# Patient Record
Sex: Female | Born: 2000 | Race: White | Hispanic: No | Marital: Single | Smoking: Never smoker
Health system: Southern US, Community
[De-identification: ages and names within clinical notes are randomized; demographics above are authoritative.]

## PROBLEM LIST (undated history)

## (undated) DIAGNOSIS — R51 Headache: Secondary | ICD-10-CM

## (undated) DIAGNOSIS — R413 Other amnesia: Secondary | ICD-10-CM

## (undated) DIAGNOSIS — R519 Headache, unspecified: Secondary | ICD-10-CM

## (undated) HISTORY — DX: Headache, unspecified: R51.9

## (undated) HISTORY — DX: Other amnesia: R41.3

## (undated) HISTORY — DX: Headache: R51

## (undated) HISTORY — PX: TONSILLECTOMY AND ADENOIDECTOMY: SHX28

---
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
## 2021-01-08 ENCOUNTER — Other Ambulatory Visit: Payer: Self-pay

## 2021-01-08 DIAGNOSIS — R3982 Chronic bladder pain: Secondary | ICD-10-CM | POA: Diagnosis not present

## 2021-01-08 DIAGNOSIS — M6289 Other specified disorders of muscle: Secondary | ICD-10-CM | POA: Diagnosis not present

## 2021-01-08 DIAGNOSIS — M62838 Other muscle spasm: Secondary | ICD-10-CM | POA: Diagnosis not present

## 2021-01-08 DIAGNOSIS — R3915 Urgency of urination: Secondary | ICD-10-CM | POA: Diagnosis not present

## 2021-01-08 DIAGNOSIS — N941 Unspecified dyspareunia: Secondary | ICD-10-CM | POA: Diagnosis not present

## 2021-03-06 DIAGNOSIS — Z6823 Body mass index (BMI) 23.0-23.9, adult: Secondary | ICD-10-CM | POA: Diagnosis not present

## 2021-03-06 DIAGNOSIS — Z01419 Encounter for gynecological examination (general) (routine) without abnormal findings: Secondary | ICD-10-CM | POA: Diagnosis not present

## 2021-03-13 ENCOUNTER — Other Ambulatory Visit: Payer: Self-pay

## 2021-03-13 DIAGNOSIS — M549 Dorsalgia, unspecified: Secondary | ICD-10-CM | POA: Diagnosis not present

## 2021-03-13 DIAGNOSIS — M419 Scoliosis, unspecified: Secondary | ICD-10-CM | POA: Diagnosis not present

## 2021-03-13 DIAGNOSIS — J0100 Acute maxillary sinusitis, unspecified: Secondary | ICD-10-CM | POA: Diagnosis not present

## 2021-03-13 DIAGNOSIS — F419 Anxiety disorder, unspecified: Secondary | ICD-10-CM | POA: Diagnosis not present

## 2021-03-13 MED ORDER — NAPROXEN 500 MG PO TABS
ORAL_TABLET | ORAL | 0 refills | Status: DC
  Filled 2021-03-13: qty 20, 10d supply, fill #0

## 2021-03-13 MED ORDER — LEVOFLOXACIN 500 MG PO TABS
ORAL_TABLET | ORAL | 0 refills | Status: DC
  Filled 2021-03-13: qty 7, 7d supply, fill #0

## 2021-03-13 MED ORDER — CYCLOBENZAPRINE HCL 5 MG PO TABS
ORAL_TABLET | ORAL | 0 refills | Status: DC
  Filled 2021-03-13: qty 20, 10d supply, fill #0

## 2021-04-09 DIAGNOSIS — H52223 Regular astigmatism, bilateral: Secondary | ICD-10-CM | POA: Diagnosis not present

## 2021-04-09 DIAGNOSIS — H5203 Hypermetropia, bilateral: Secondary | ICD-10-CM | POA: Diagnosis not present

## 2021-04-10 DIAGNOSIS — F4323 Adjustment disorder with mixed anxiety and depressed mood: Secondary | ICD-10-CM | POA: Diagnosis not present

## 2021-04-17 DIAGNOSIS — F4323 Adjustment disorder with mixed anxiety and depressed mood: Secondary | ICD-10-CM | POA: Diagnosis not present

## 2021-04-24 DIAGNOSIS — F4323 Adjustment disorder with mixed anxiety and depressed mood: Secondary | ICD-10-CM | POA: Diagnosis not present

## 2021-05-15 DIAGNOSIS — F4323 Adjustment disorder with mixed anxiety and depressed mood: Secondary | ICD-10-CM | POA: Diagnosis not present

## 2021-05-20 DIAGNOSIS — F4323 Adjustment disorder with mixed anxiety and depressed mood: Secondary | ICD-10-CM | POA: Diagnosis not present

## 2021-06-10 ENCOUNTER — Other Ambulatory Visit: Payer: Self-pay

## 2021-06-11 ENCOUNTER — Other Ambulatory Visit: Payer: Self-pay

## 2021-06-11 MED ORDER — FLUOXETINE HCL 20 MG PO CAPS
ORAL_CAPSULE | ORAL | 1 refills | Status: DC
  Filled 2021-06-11: qty 90, 90d supply, fill #0

## 2021-06-12 ENCOUNTER — Other Ambulatory Visit: Payer: Self-pay

## 2021-06-12 DIAGNOSIS — Z0000 Encounter for general adult medical examination without abnormal findings: Secondary | ICD-10-CM | POA: Diagnosis not present

## 2021-06-12 DIAGNOSIS — Z131 Encounter for screening for diabetes mellitus: Secondary | ICD-10-CM | POA: Diagnosis not present

## 2021-06-12 DIAGNOSIS — F419 Anxiety disorder, unspecified: Secondary | ICD-10-CM | POA: Diagnosis not present

## 2021-06-12 DIAGNOSIS — Z114 Encounter for screening for human immunodeficiency virus [HIV]: Secondary | ICD-10-CM | POA: Diagnosis not present

## 2021-06-12 DIAGNOSIS — Z1159 Encounter for screening for other viral diseases: Secondary | ICD-10-CM | POA: Diagnosis not present

## 2021-06-12 DIAGNOSIS — F330 Major depressive disorder, recurrent, mild: Secondary | ICD-10-CM | POA: Diagnosis not present

## 2021-06-12 DIAGNOSIS — M419 Scoliosis, unspecified: Secondary | ICD-10-CM | POA: Diagnosis not present

## 2021-06-12 DIAGNOSIS — Z13220 Encounter for screening for lipoid disorders: Secondary | ICD-10-CM | POA: Diagnosis not present

## 2021-06-12 DIAGNOSIS — M546 Pain in thoracic spine: Secondary | ICD-10-CM | POA: Diagnosis not present

## 2021-06-12 MED ORDER — BUPROPION HCL ER (SR) 100 MG PO TB12
ORAL_TABLET | ORAL | 0 refills | Status: DC
  Filled 2021-06-12: qty 60, 30d supply, fill #0

## 2021-06-12 MED ORDER — FLUOXETINE HCL 10 MG PO CAPS
ORAL_CAPSULE | ORAL | 0 refills | Status: DC
  Filled 2021-06-12: qty 21, 21d supply, fill #0

## 2021-10-12 DIAGNOSIS — R300 Dysuria: Secondary | ICD-10-CM | POA: Diagnosis not present

## 2021-10-15 DIAGNOSIS — R059 Cough, unspecified: Secondary | ICD-10-CM | POA: Diagnosis not present

## 2021-10-18 DIAGNOSIS — R300 Dysuria: Secondary | ICD-10-CM | POA: Diagnosis not present

## 2021-10-18 DIAGNOSIS — N3001 Acute cystitis with hematuria: Secondary | ICD-10-CM | POA: Diagnosis not present

## 2021-10-18 NOTE — Progress Notes (Signed)
 [facility] ENCOUNTER     CLINICAL IMPRESSION Final diagnoses:  Dysuria  Acute cystitis with hematuria (Primary)    ASSESSMENT/PLAN & ED COURSE  Is an afebrile, nontoxic-appearing 21 year old female presenting for evaluation of continued urinary symptoms and lower back pain following treatment with Macrobid for a UTI that was diagnosed at [facility] with culture showing pansensitive E. coli.  On exam patient has bilateral CVA tenderness.  Patient's UA in clinic was positive for UTI with positive nitrates, positive leukocytes, positive heme.  We will start patient on a course of Bactrim due to concerns for possible extension to her kidneys with her CVA tenderness.  Patient be contacted with the results of her culture that was sent to confirm the source of her UTI.  There are concerns for possible pyelonephritis.  Pertinent labs & imaging results which were available during my care of the patient were reviewed by me (see chart for details).   _______________________________________________________________  Time seen: October 18, 2021 6:32 PM  I have reviewed the triage vital signs and the nursing notes.  CHIEF COMPLAINT   Chief Complaint  Patient presents with  . Possible UTI    Pt states that she was diagnosed with UTI by [facility] on Monday. Pt was given macrobid with no improvement. Pt states that low back pain is worse, burning with urination, and lower abominal pain and pressure . Last dose Azo 1 hr ago. Per pt, urine culture showed E. Coli    HPI  Danielle [name] is a 21 y.o. female with past medical history as listed below and recent diagnosis of a UTI at [facility] this past Monday and treatment with Macrobid over the past 5 days with no improvement in symptoms and onset of bilateral back pain.  Patient states usually gets back pain with UTIs however is not this significant and not as high up.   PAST MEDICAL HISTORY   No  past medical history on file.  Patient Active Problem List  Diagnosis  . Word finding difficulty  . Transient disorder of initiating or maintaining sleep  . Subjective memory complaints  . New daily persistent headache  . Nausea  . Hypotension  . Food intolerance  . Disequilibrium  . Anxiety     SURGICAL HISTORY   Past Surgical History:  Procedure Laterality Date  . TONSILLECTOMY      CURRENT MEDICATIONS    Current Outpatient Medications:  .  buPROPion  (WELLBUTRIN  SR) 100 MG 12 hr tablet, Take by mouth., Disp: , Rfl:  .  fluticasone propionate (FLONASE) 50 mcg/actuation nasal spray, , Disp: , Rfl:  .  levonorgestreL  (KYLEENA ) 19.5 mcg/24 hr (5 years) IUD IUD, Take 1 device by intrauterine route as directed., Disp: , Rfl:  .  sulfamethoxazole-trimethoprim (BACTRIM DS) 800-160 mg per tablet, Take 1 tablet (160 mg of trimethoprim total) by mouth Two (2) times a day for 5 days., Disp: 10 tablet, Rfl: 0  ALLERGIES   Allergies  Allergen Reactions  . Adhesive Tape-Silicones Rash  . Penicillins Rash     PHYSICAL EXAM    VITAL SIGNS:   Vitals:   10/18/21 1823  BP: 110/73  Pulse: 85  Resp: 16  Temp: 37.5 C (99.5 F)  SpO2: 97%     Constitutional: Alert and oriented. Well appearing and in no distress. HENT      Ears: Bilateral TM pearly grey with no erythema, discharge or effusion.      Head:  Normocephalic and atraumatic.      Eyes: Conjunctivae are normal.      Nose: No congestion.      Mouth/Throat: Mucous membranes are moist. Neck: No stridor. Cardiovascular: Normal rate, regular rhythm. Normal and symmetric distal pulses are present in all extremities. Pulmonary/Chest: Normal respiratory effort. Breath sounds are normal. Abdominal: Soft and nontender. There is no CVA tenderness. Musculoskeletal: Nontender with normal range of motion in all extremities.      Right lower leg: No tenderness or edema.      Left lower leg: No tenderness or edema. Neurological:  Normal speech and language. No gross focal neurologic deficits are appreciated. Skin: Skin is warm, dry and intact. No rash noted. Psychiatric:Normal mood and affect. Speech and behavior are normal.   Any obtained pertinent Labs & Imaging studies were reviewed. (See chart for details)  LABS Results for orders placed or performed in visit on 10/18/21  POCT Urinalysis Automated  Result Value Ref Range   Color, UA     Clarity, UA     Glucose, UA 250 mg/dL Negative   Bilirubin, UA Small Negative   Ketones, POC 15 mg/dL Negative   Spec Grav, UA 1.010 1.005 - 1.030   Blood, UA Negative Negative   pH, UA 5.0 5.0 - 9.0   Protein, UA >/= 300 mg/dL Negative   Urobilinogen, UA 4.0 mg/dL Negative (0.2 mg/dL)   Nitrite, UA Positive Negative   Leukocytes, UA Large Negative   UA Strip Lot Number [id number]    UA Strip Lot Expiration 07/2022    Serial Number [id number]     EKG   No results found for this visit on 10/18/21 (from the past 4464 hour(s)).  RADIOLOGY   No results found.  MEDICATIONS ADMINISTERED DURING THE VISIT   PRESCRIPTIONS THIS VISIT New Prescriptions   SULFAMETHOXAZOLE-TRIMETHOPRIM (BACTRIM DS) 800-160 MG PER TABLET    Take 1 tablet (160 mg of trimethoprim total) by mouth Two (2) times a day for 5 days.     I reviewed indications for follow-up and ED precautions including worsening shortness of breath, prolonged fevers, or any atypical symptoms.   The patient was given an opportunity to ask questions about the treatment plan, and all questions were addressed to the best of my ability with the information available. Patient stable at the time of discharge        Portions of this record have been created using Scientist, clinical (histocompatibility and immunogenetics). Dictation errors have been sought, but may not have been identified and corrected.

## 2021-12-17 DIAGNOSIS — N911 Secondary amenorrhea: Secondary | ICD-10-CM | POA: Diagnosis not present

## 2021-12-17 DIAGNOSIS — N912 Amenorrhea, unspecified: Secondary | ICD-10-CM | POA: Diagnosis not present

## 2022-03-10 DIAGNOSIS — Z113 Encounter for screening for infections with a predominantly sexual mode of transmission: Secondary | ICD-10-CM | POA: Diagnosis not present

## 2022-03-10 DIAGNOSIS — Z01419 Encounter for gynecological examination (general) (routine) without abnormal findings: Secondary | ICD-10-CM | POA: Diagnosis not present

## 2022-03-10 DIAGNOSIS — Z6821 Body mass index (BMI) 21.0-21.9, adult: Secondary | ICD-10-CM | POA: Diagnosis not present

## 2022-03-10 DIAGNOSIS — N760 Acute vaginitis: Secondary | ICD-10-CM | POA: Diagnosis not present

## 2022-03-12 ENCOUNTER — Other Ambulatory Visit: Payer: Self-pay

## 2022-03-12 DIAGNOSIS — F330 Major depressive disorder, recurrent, mild: Secondary | ICD-10-CM | POA: Diagnosis not present

## 2022-03-12 DIAGNOSIS — F411 Generalized anxiety disorder: Secondary | ICD-10-CM | POA: Diagnosis not present

## 2022-03-12 DIAGNOSIS — M419 Scoliosis, unspecified: Secondary | ICD-10-CM | POA: Diagnosis not present

## 2022-03-12 MED ORDER — BUPROPION HCL ER (XL) 150 MG PO TB24
150.0000 mg | ORAL_TABLET | Freq: Every day | ORAL | 1 refills | Status: DC
  Filled 2022-03-12: qty 90, 90d supply, fill #0

## 2022-05-04 DIAGNOSIS — J019 Acute sinusitis, unspecified: Secondary | ICD-10-CM | POA: Diagnosis not present

## 2022-06-23 DIAGNOSIS — Z111 Encounter for screening for respiratory tuberculosis: Secondary | ICD-10-CM | POA: Diagnosis not present

## 2022-06-25 DIAGNOSIS — Z111 Encounter for screening for respiratory tuberculosis: Secondary | ICD-10-CM | POA: Diagnosis not present

## 2022-07-02 ENCOUNTER — Telehealth: Payer: Self-pay | Admitting: Physician Assistant

## 2022-07-02 ENCOUNTER — Ambulatory Visit (INDEPENDENT_AMBULATORY_CARE_PROVIDER_SITE_OTHER): Payer: Commercial Managed Care - PPO | Admitting: Physician Assistant

## 2022-07-02 ENCOUNTER — Other Ambulatory Visit: Payer: Self-pay

## 2022-07-02 ENCOUNTER — Encounter: Payer: Self-pay | Admitting: Physician Assistant

## 2022-07-02 VITALS — BP 118/80 | HR 105 | Temp 98.3°F | Resp 16 | Ht 67.0 in | Wt 135.6 lb

## 2022-07-02 DIAGNOSIS — E782 Mixed hyperlipidemia: Secondary | ICD-10-CM

## 2022-07-02 DIAGNOSIS — R946 Abnormal results of thyroid function studies: Secondary | ICD-10-CM

## 2022-07-02 DIAGNOSIS — G43009 Migraine without aura, not intractable, without status migrainosus: Secondary | ICD-10-CM | POA: Diagnosis not present

## 2022-07-02 DIAGNOSIS — Z7689 Persons encountering health services in other specified circumstances: Secondary | ICD-10-CM | POA: Diagnosis not present

## 2022-07-02 DIAGNOSIS — R5383 Other fatigue: Secondary | ICD-10-CM | POA: Diagnosis not present

## 2022-07-02 MED ORDER — SUMATRIPTAN SUCCINATE 50 MG PO TABS
50.0000 mg | ORAL_TABLET | ORAL | 0 refills | Status: DC | PRN
  Filled 2022-07-02 (×2): qty 9, 15d supply, fill #0

## 2022-07-02 MED ORDER — TOPIRAMATE 25 MG PO TABS
25.0000 mg | ORAL_TABLET | Freq: Two times a day (BID) | ORAL | 2 refills | Status: DC
  Filled 2022-07-02: qty 60, 30d supply, fill #0

## 2022-07-02 NOTE — Progress Notes (Signed)
[facility] [address] [place], [place] [address]  Internal MEDICINE  Office Visit Note  Patient Name: Danielle [name]  [id number]  [id number]  Date of Service: 07/06/2022   Complaints/HPI Pt is here for establishment of PCP. Chief Complaint  Patient presents with   New Patient (Initial Visit)   Migraine    Prescribed medication, stopped taking it, but might need it again.   ADHD    Might need to be screened for ADHD.   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 -Migraines for a long time, have been more frequent. Headache every day now, will try to avoid taking ibuprofen daily, this only dulls the ache doesn't stop it. At least every other week has a migraine that she then goes to a dark quiet room and tries to sleep it off.  -Previously was on an as needed med, but never knew when migraine were starting and would set in before she could the medication in time. Interested in a preventive medication given frequency of headaches. -Does take wellbutrin, doesn't take everyday--admits she forgets. Plans to restart on Monday. Takes this for anxiety -Only takes flexeril as needed -Does feel like she has ADHD, loses focus and never gets anything done. Starts a task then stops and moves on. Will address further once headaches better controlled. Will also get back on wellbutrin daily and see if this helps too. -Lives with BF, 2 pups, has an ESA beagle. Used to see psych and may try to find another provider who could then also address ADHD as well as her anxiety -sees  Gyn in a few months  Current Medication: Outpatient Encounter Medications as of 07/02/2022  Medication Sig   buPROPion (WELLBUTRIN XL) 150 MG 24 hr tablet Take 1 tablet (150 mg total) by mouth daily.   cyclobenzaprine (FLEXERIL) 5 MG tablet Take 1 tablet (5 mg total) by mouth 2 (two) times daily as needed for muscle spasms for up to 10 days   ibuprofen (ADVIL,MOTRIN) 200 MG tablet Take 2 tablets at onset of headache, may repeat in 4-6 hours as needed   SUMAtriptan (IMITREX) 50 MG tablet Take 1 tablet (50 mg total) by mouth every 2 (two) hours as needed for migraine. May repeat in 2 hours if headache persists or recurs.   topiramate (TOPAMAX) 25 MG tablet Take 1 tablet (25 mg total) by mouth 2 (two) times daily.   [DISCONTINUED] buPROPion ER (WELLBUTRIN SR) 100 MG 12 hr tablet Take 1 tablet (100 mg total) by mouth 2 (two) times daily Take 1 tablet in the morning with breakfast and second tablet around 3 PM   [DISCONTINUED] FLUoxetine (PROZAC) 10 MG capsule Take 1 capsule (10 mg total) by mouth once daily   [DISCONTINUED] FLUoxetine (PROZAC) 20 MG capsule Take 1 capsule (20 mg total) by mouth once daily   [DISCONTINUED] FLUoxetine (PROZAC) 20 MG capsule Take 1 capsule (20 mg total) by mouth once daily   [DISCONTINUED] levofloxacin (LEVAQUIN) 500 MG tablet Take 1 tablet (500 mg total) by mouth once daily  for 7 days   [DISCONTINUED] magnesium oxide (MAG-OX) 400 MG tablet Take 400 mg by mouth daily.   [DISCONTINUED] naproxen (NAPROSYN) 500 MG tablet Take 1 tablet (500 mg total) by mouth 2 (two) times daily with meals for 10 days   No facility-administered encounter medications on file as of 07/02/2022.    Surgical History: Past Surgical History:  Procedure Laterality Date   TONSILLECTOMY AND ADENOIDECTOMY     WISDOM TOOTH EXTRACTION  2022    Medical History: Past Medical History:  Diagnosis Date   Headache    Memory loss     Family History: Family History  Problem Relation Age of Onset    Cancer Mother    Cancer Maternal Aunt    Cancer Maternal Grandmother     Social History   Socioeconomic History   Marital status: Single    Spouse name: Not on file   Number of children: Not on file   Years of education: Not on file   Highest education level: Not on file  Occupational History   Not on file  Tobacco Use   Smoking status: Never   Smokeless tobacco: Never  Vaping Use   Vaping Use: Every day  Substance and Sexual Activity   Alcohol use: Not on file   Drug use: Never   Sexual activity: Yes  Other Topics Concern   Not on file  Social History Narrative   Lives with mom, grandfather, step father and younger sister   Home schooled going into 10th grade.   Social Determinants of Health   Financial Resource Strain: Not on file  Food Insecurity: Not on file  Transportation Needs: Not on file  Physical Activity: Not on file  Stress: Not on file  Social Connections: Not on file  Intimate Partner Violence: Not on file     Review of Systems  Constitutional:  Negative for chills, diaphoresis and fatigue.  HENT:  Negative for ear pain, postnasal drip and sinus pressure.   Eyes:  Negative for discharge, redness, itching and visual disturbance.  Respiratory:  Negative for cough, shortness of breath and wheezing.   Cardiovascular:  Negative for chest pain, palpitations and leg swelling.  Gastrointestinal:  Negative for abdominal pain, constipation, diarrhea, nausea and vomiting.  Genitourinary:  Negative for dysuria and flank pain.  Musculoskeletal:  Negative for arthralgias, back pain, gait problem and neck pain.  Skin:  Negative for color change.  Allergic/Immunologic: Negative for environmental allergies and food allergies.  Neurological:  Positive for headaches. Negative for dizziness.  Hematological:  Does not bruise/bleed easily.  Psychiatric/Behavioral:  Positive for decreased concentration. Negative for agitation, behavioral problems (depression),  hallucinations and suicidal ideas. The patient is nervous/anxious.     Vital Signs: BP 118/80   Pulse (!) 105   Temp 98.3 F (36.8 C)   Resp 16   Ht 5\' 7"  (1.702 m)   Wt 135 lb 9.6 oz (61.5 kg)   SpO2 98%   BMI 21.24 kg/m    Physical Exam Vitals and nursing note reviewed.  Constitutional:      General: She is not in acute distress.    Appearance: Normal appearance. She is well-developed and normal weight. She is not diaphoretic.  HENT:     Head: Normocephalic and atraumatic.     Mouth/Throat:     Pharynx: No oropharyngeal exudate.  Eyes:     Pupils: Pupils are equal, round, and reactive to light.  Neck:     Thyroid: No thyromegaly.  Vascular: No JVD.     Trachea: No tracheal deviation.  Cardiovascular:     Rate and Rhythm: Normal rate and regular rhythm.     Heart sounds: Normal heart sounds. No murmur heard.    No friction rub. No gallop.  Pulmonary:     Effort: Pulmonary effort is normal. No respiratory distress.     Breath sounds: No wheezing or rales.  Chest:     Chest wall: No tenderness.  Abdominal:     General: Bowel sounds are normal.     Palpations: Abdomen is soft.  Musculoskeletal:        General: Normal range of motion.     Cervical back: Normal range of motion and neck supple.  Lymphadenopathy:     Cervical: No cervical adenopathy.  Skin:    General: Skin is warm and dry.  Neurological:     Mental Status: She is alert and oriented to person, place, and time.     Cranial Nerves: No cranial nerve deficit.  Psychiatric:        Behavior: Behavior normal.        Thought Content: Thought content normal.        Judgment: Judgment normal.       Assessment/Plan: 1. Migraine without aura and without status migrainosus, not intractable Will start topamax daily and use sumatriptan prn for acute migraines - topiramate (TOPAMAX) 25 MG tablet; Take 1 tablet (25 mg total) by mouth 2 (two) times daily.  Dispense: 60 tablet; Refill: 2 - SUMAtriptan  (IMITREX) 50 MG tablet; Take 1 tablet (50 mg total) by mouth every 2 (two) hours as needed for migraine. May repeat in 2 hours if headache persists or recurs.  Dispense: 9 tablet; Refill: 0  2. Mixed hyperlipidemia - Lipid Panel With LDL/HDL Ratio  3. Abnormal thyroid exam - TSH + free T4  4. Other fatigue - CBC w/Diff/Platelet - Comprehensive metabolic panel - TSH + free T4 - Lipid Panel With LDL/HDL Ratio  5. Encounter to establish care with new doctor Will request medical records and will order labs for CPE. Will look into what is needed for new job   General Counseling: Lyndel verbalizes understanding of the findings of todays visit and agrees with plan of treatment. I have discussed any further diagnostic evaluation that may be needed or ordered today. We also reviewed her medications today. she has been encouraged to call the office with any questions or concerns that should arise related to todays visit.    Counseling:    Orders Placed This Encounter  Procedures   CBC w/Diff/Platelet   Comprehensive metabolic panel   TSH + free T4   Lipid Panel With LDL/HDL Ratio    Meds ordered this encounter  Medications   topiramate (TOPAMAX) 25 MG tablet    Sig: Take 1 tablet (25 mg total) by mouth 2 (two) times daily.    Dispense:  60 tablet    Refill:  2   SUMAtriptan (IMITREX) 50 MG tablet    Sig: Take 1 tablet (50 mg total) by mouth every 2 (two) hours as needed for migraine. May repeat in 2 hours if headache persists or recurs.    Dispense:  9 tablet    Refill:  0     This patient was seen by Lynn Ito, PA-C in collaboration with Dr. Beverely Risen as a part of collaborative care agreement.   Time spent:35 Minutes

## 2022-07-02 NOTE — Telephone Encounter (Signed)
MR request faxed to [facility]; [phone]

## 2022-07-19 ENCOUNTER — Other Ambulatory Visit: Payer: Self-pay

## 2022-07-19 ENCOUNTER — Ambulatory Visit (INDEPENDENT_AMBULATORY_CARE_PROVIDER_SITE_OTHER): Payer: Commercial Managed Care - PPO | Admitting: Physician Assistant

## 2022-07-19 ENCOUNTER — Encounter: Payer: Self-pay | Admitting: Physician Assistant

## 2022-07-19 VITALS — BP 130/60 | HR 99 | Temp 98.4°F | Resp 16 | Ht 67.0 in | Wt 135.0 lb

## 2022-07-19 DIAGNOSIS — R946 Abnormal results of thyroid function studies: Secondary | ICD-10-CM | POA: Diagnosis not present

## 2022-07-19 DIAGNOSIS — Z0001 Encounter for general adult medical examination with abnormal findings: Secondary | ICD-10-CM | POA: Diagnosis not present

## 2022-07-19 DIAGNOSIS — G43009 Migraine without aura, not intractable, without status migrainosus: Secondary | ICD-10-CM | POA: Diagnosis not present

## 2022-07-19 DIAGNOSIS — R300 Dysuria: Secondary | ICD-10-CM | POA: Diagnosis not present

## 2022-07-19 DIAGNOSIS — R5383 Other fatigue: Secondary | ICD-10-CM | POA: Diagnosis not present

## 2022-07-19 DIAGNOSIS — F411 Generalized anxiety disorder: Secondary | ICD-10-CM | POA: Diagnosis not present

## 2022-07-19 DIAGNOSIS — E782 Mixed hyperlipidemia: Secondary | ICD-10-CM | POA: Diagnosis not present

## 2022-07-19 MED ORDER — BUPROPION HCL ER (XL) 150 MG PO TB24
150.0000 mg | ORAL_TABLET | Freq: Every day | ORAL | 1 refills | Status: DC
  Filled 2022-07-19: qty 90, 90d supply, fill #0

## 2022-07-19 NOTE — Progress Notes (Unsigned)
[facility] [address] [place], [place] [address]  Internal MEDICINE  Office Visit Note  Patient Name: Danielle [name]  [id number]  [id number]  Date of Service: 07/20/2022  Chief Complaint  Patient presents with   Annual Exam   Medication Refill    Patient lost anxiety meds, needs refill     HPI Pt is here for routine health maintenance examination -Topamax is helping a lot with migraines. Did have to take the acute imitrex for one acute migraine and the imitrex made her nauseous and caused tingling and made the migraine worse. This was her only migraine and it was the day after the topamax. A few dull headaches, but didn't require any treatment including NSAIDs/tylenol -given ubrelvy and nurtec samples and pt will call for script if desired -Had labs done this morning -recently got back from vacation in [place] with her friends -She is looking for a psychiatrist in [place] area closer to her. She was on wellbutrin from previous provider but had stopped and planned to restart but was unable to locate her medication and requests refill today to restart on this for anxiety as well as possible help with ADHD-like symptoms. She has not been diagnosed with this, but thinks she may have it. She plans to discuss with psych as well once she establishes -Her medical records have also been requested for immunizations.  -She has appt with GYN for pap coming up -will be starting new job soon which required she have a general physical, but did not require any formal occupational health testing or forms to be completed  Current Medication: Outpatient Encounter Medications as of 07/19/2022  Medication Sig   cyclobenzaprine (FLEXERIL) 5 MG tablet Take 1 tablet (5 mg total) by mouth 2 (two) times daily as needed for muscle spasms for up to 10 days   ibuprofen (ADVIL,MOTRIN) 200 MG tablet Take 2 tablets at onset of headache, may repeat in 4-6 hours as needed   topiramate (TOPAMAX) 25  MG tablet Take 1 tablet (25 mg total) by mouth 2 (two) times daily.   [DISCONTINUED] buPROPion (WELLBUTRIN XL) 150 MG 24 hr tablet Take 1 tablet (150 mg total) by mouth daily.   [DISCONTINUED] SUMAtriptan (IMITREX) 50 MG tablet Take 1 tablet (50 mg total) by mouth every 2 (two) hours as needed for migraine. May repeat in 2 hours if headache persists or recurs.   buPROPion (WELLBUTRIN XL) 150 MG 24 hr tablet Take 1 tablet (150 mg total) by mouth daily.   No facility-administered encounter medications on file as of 07/19/2022.    Surgical History: Past Surgical History:  Procedure Laterality Date   TONSILLECTOMY AND ADENOIDECTOMY     WISDOM TOOTH EXTRACTION  2022    Medical History: Past Medical History:  Diagnosis Date   Headache    Memory loss     Family History: Family History  Problem Relation Age of Onset   Cancer Mother    Cancer Maternal Aunt    Cancer Maternal Grandmother       Review of Systems  Constitutional:  Negative for chills, diaphoresis and fatigue.  HENT:  Negative for ear pain, postnasal drip and sinus pressure.   Eyes:  Negative for discharge, redness, itching and visual disturbance.  Respiratory:  Negative for cough, shortness of breath and wheezing.   Cardiovascular:  Negative for chest pain, palpitations and leg swelling.  Gastrointestinal:  Negative for abdominal pain, constipation, diarrhea, nausea and vomiting.  Genitourinary:  Negative for dysuria and flank  pain.  Musculoskeletal:  Negative for arthralgias, back pain, gait problem and neck pain.  Skin:  Negative for color change.  Allergic/Immunologic: Negative for environmental allergies and food allergies.  Neurological:  Negative for dizziness.  Hematological:  Does not bruise/bleed easily.  Psychiatric/Behavioral:  Positive for decreased concentration. Negative for agitation, behavioral problems (depression), hallucinations and suicidal ideas. The patient is nervous/anxious.      Vital  Signs: BP 130/60   Pulse 99   Temp 98.4 F (36.9 C)   Resp 16   Ht 5\' 7"  (1.702 m)   Wt 135 lb (61.2 kg)   SpO2 99%   BMI 21.14 kg/m    Physical Exam Vitals and nursing note reviewed.  Constitutional:      General: She is not in acute distress.    Appearance: Normal appearance. She is well-developed and normal weight. She is not diaphoretic.  HENT:     Head: Normocephalic and atraumatic.     Mouth/Throat:     Pharynx: No oropharyngeal exudate.  Eyes:     Pupils: Pupils are equal, round, and reactive to light.  Neck:     Thyroid: No thyromegaly.     Vascular: No JVD.     Trachea: No tracheal deviation.  Cardiovascular:     Rate and Rhythm: Normal rate and regular rhythm.     Heart sounds: Normal heart sounds. No murmur heard.    No friction rub. No gallop.  Pulmonary:     Effort: Pulmonary effort is normal. No respiratory distress.     Breath sounds: No wheezing or rales.  Chest:     Chest wall: No tenderness.  Breasts:    Right: Normal. No mass.     Left: Normal. No mass.  Abdominal:     General: Bowel sounds are normal.     Palpations: Abdomen is soft.     Tenderness: There is no abdominal tenderness.  Musculoskeletal:        General: Normal range of motion.     Cervical back: Normal range of motion and neck supple.  Lymphadenopathy:     Cervical: No cervical adenopathy.  Skin:    General: Skin is warm and dry.  Neurological:     Mental Status: She is alert and oriented to person, place, and time.     Cranial Nerves: No cranial nerve deficit.  Psychiatric:        Behavior: Behavior normal.        Thought Content: Thought content normal.        Judgment: Judgment normal.      LABS: Recent Results (from the past 2160 hour(s))  CBC w/Diff/Platelet     Status: None   Collection Time: 07/19/22  1:31 PM  Result Value Ref Range   WBC 4.5 3.4 - 10.8 x10E3/uL   RBC 4.76 3.77 - 5.28 x10E6/uL   Hemoglobin 14.1 11.1 - 15.9 g/dL   Hematocrit 16.1 09.6 - 46.6  %   MCV 88 79 - 97 fL   MCH 29.6 26.6 - 33.0 pg   MCHC 33.6 31.5 - 35.7 g/dL   RDW 04.5 40.9 - 81.1 %   Platelets 285 150 - 450 x10E3/uL   Neutrophils 56 Not Estab. %   Lymphs 35 Not Estab. %   Monocytes 7 Not Estab. %   Eos 1 Not Estab. %   Basos 1 Not Estab. %   Neutrophils Absolute 2.5 1.4 - 7.0 x10E3/uL   Lymphocytes Absolute 1.6 0.7 - 3.1 x10E3/uL  Monocytes Absolute 0.3 0.1 - 0.9 x10E3/uL   EOS (ABSOLUTE) 0.1 0.0 - 0.4 x10E3/uL   Basophils Absolute 0.0 0.0 - 0.2 x10E3/uL   Immature Granulocytes 0 Not Estab. %   Immature Grans (Abs) 0.0 0.0 - 0.1 x10E3/uL  Comprehensive metabolic panel     Status: None   Collection Time: 07/19/22  1:31 PM  Result Value Ref Range   Glucose 79 70 - 99 mg/dL   BUN 11 6 - 20 mg/dL   Creatinine, Ser 1.91 0.57 - 1.00 mg/dL   eGFR 478 >29 FA/OZH/0.86   BUN/Creatinine Ratio 14 9 - 23   Sodium 141 134 - 144 mmol/L   Potassium 4.4 3.5 - 5.2 mmol/L   Chloride 105 96 - 106 mmol/L   CO2 23 20 - 29 mmol/L   Calcium 9.6 8.7 - 10.2 mg/dL   Total Protein 7.4 6.0 - 8.5 g/dL   Albumin 4.7 4.0 - 5.0 g/dL   Globulin, Total 2.7 1.5 - 4.5 g/dL   Bilirubin Total 0.7 0.0 - 1.2 mg/dL   Alkaline Phosphatase 63 44 - 121 IU/L   AST 18 0 - 40 IU/L   ALT 15 0 - 32 IU/L  TSH + free T4     Status: None   Collection Time: 07/19/22  1:31 PM  Result Value Ref Range   TSH 2.550 0.450 - 4.500 uIU/mL   Free T4 1.30 0.82 - 1.77 ng/dL  Lipid Panel With LDL/HDL Ratio     Status: Abnormal   Collection Time: 07/19/22  1:31 PM  Result Value Ref Range   Cholesterol, Total 180 100 - 199 mg/dL   Triglycerides 65 0 - 149 mg/dL   HDL 47 >57 mg/dL   VLDL Cholesterol Cal 12 5 - 40 mg/dL   LDL Chol Calc (NIH) 846 (H) 0 - 99 mg/dL   LDL/HDL Ratio 2.6 0.0 - 3.2 ratio    Comment:                                     LDL/HDL Ratio                                             Men  Women                               1/2 Avg.Risk  1.0    1.5                                    Avg.Risk  3.6    3.2                                2X Avg.Risk  6.2    5.0                                3X Avg.Risk  8.0    6.1   UA/M w/rflx Culture, Routine     Status: None   Collection Time: 07/19/22  3:54 PM   Specimen: Urine   Urine  Result Value Ref Range   Specific Gravity, UA 1.021 1.005 - 1.030   pH, UA 6.0 5.0 - 7.5   Color, UA Yellow Yellow   Appearance Ur Clear Clear   Leukocytes,UA Negative Negative   Protein,UA Negative Negative/Trace   Glucose, UA Negative Negative   Ketones, UA Negative Negative   RBC, UA Negative Negative   Bilirubin, UA Negative Negative   Urobilinogen, Ur 0.2 0.2 - 1.0 mg/dL   Nitrite, UA Negative Negative   Microscopic Examination Comment     Comment: Microscopic follows if indicated.   Microscopic Examination See below:     Comment: Microscopic was indicated and was performed.   Urinalysis Reflex Comment     Comment: This specimen will not reflex to a Urine Culture.  Microscopic Examination     Status: None   Collection Time: 07/19/22  3:54 PM   Urine  Result Value Ref Range   WBC, UA 0-5 0 - 5 /hpf   RBC, Urine None seen 0 - 2 /hpf   Epithelial Cells (non renal) 0-10 0 - 10 /hpf   Casts None seen None seen /lpf   Bacteria, UA Few None seen/Few        Assessment/Plan: 1. Encounter for general adult medical examination with abnormal findings CPE performed, labs done but not resulted yet. Records for immunizations requested  2. Migraine without aura and without status migrainosus, not intractable Improving on topamax, given ubrelvy and nurtec samples to try for acute migraine and pt may call for script if needed  3. GAD (generalized anxiety disorder) - buPROPion (WELLBUTRIN XL) 150 MG 24 hr tablet; Take 1 tablet (150 mg total) by mouth daily.  Dispense: 90 tablet; Refill: 1  4. Dysuria - UA/M w/rflx Culture, Routine   General Counseling: Netanya verbalizes understanding of the findings of todays visit and agrees with plan  of treatment. I have discussed any further diagnostic evaluation that may be needed or ordered today. We also reviewed her medications today. she has been encouraged to call the office with any questions or concerns that should arise related to todays visit.    Counseling:    Orders Placed This Encounter  Procedures   Microscopic Examination   UA/M w/rflx Culture, Routine    Meds ordered this encounter  Medications   buPROPion (WELLBUTRIN XL) 150 MG 24 hr tablet    Sig: Take 1 tablet (150 mg total) by mouth daily.    Dispense:  90 tablet    Refill:  1    This patient was seen by Lynn Ito, PA-C in collaboration with Dr. Beverely Risen as a part of collaborative care agreement.  Total time spent:35 Minutes  Time spent includes review of chart, medications, test results, and follow up plan with the patient.     Lyndon Code, MD  Internal Medicine

## 2022-07-20 ENCOUNTER — Telehealth: Payer: Self-pay

## 2022-07-20 LAB — CBC WITH DIFFERENTIAL/PLATELET
Basophils Absolute: 0 10*3/uL (ref 0.0–0.2)
Basos: 1 %
EOS (ABSOLUTE): 0.1 10*3/uL (ref 0.0–0.4)
Eos: 1 %
Hematocrit: 42 % (ref 34.0–46.6)
Hemoglobin: 14.1 g/dL (ref 11.1–15.9)
Immature Grans (Abs): 0 10*3/uL (ref 0.0–0.1)
Immature Granulocytes: 0 %
Lymphocytes Absolute: 1.6 10*3/uL (ref 0.7–3.1)
Lymphs: 35 %
MCH: 29.6 pg (ref 26.6–33.0)
MCHC: 33.6 g/dL (ref 31.5–35.7)
MCV: 88 fL (ref 79–97)
Monocytes Absolute: 0.3 10*3/uL (ref 0.1–0.9)
Monocytes: 7 %
Neutrophils Absolute: 2.5 10*3/uL (ref 1.4–7.0)
Neutrophils: 56 %
Platelets: 285 10*3/uL (ref 150–450)
RBC: 4.76 x10E6/uL (ref 3.77–5.28)
RDW: 12.6 % (ref 11.7–15.4)
WBC: 4.5 10*3/uL (ref 3.4–10.8)

## 2022-07-20 LAB — COMPREHENSIVE METABOLIC PANEL
ALT: 15 IU/L (ref 0–32)
AST: 18 IU/L (ref 0–40)
Albumin: 4.7 g/dL (ref 4.0–5.0)
Alkaline Phosphatase: 63 IU/L (ref 44–121)
BUN/Creatinine Ratio: 14 (ref 9–23)
BUN: 11 mg/dL (ref 6–20)
Bilirubin Total: 0.7 mg/dL (ref 0.0–1.2)
CO2: 23 mmol/L (ref 20–29)
Calcium: 9.6 mg/dL (ref 8.7–10.2)
Chloride: 105 mmol/L (ref 96–106)
Creatinine, Ser: 0.81 mg/dL (ref 0.57–1.00)
Globulin, Total: 2.7 g/dL (ref 1.5–4.5)
Glucose: 79 mg/dL (ref 70–99)
Potassium: 4.4 mmol/L (ref 3.5–5.2)
Sodium: 141 mmol/L (ref 134–144)
Total Protein: 7.4 g/dL (ref 6.0–8.5)
eGFR: 106 mL/min/{1.73_m2} (ref >59)

## 2022-07-20 LAB — UA/M W/RFLX CULTURE, ROUTINE
Bilirubin, UA: NEGATIVE
Glucose, UA: NEGATIVE
Ketones, UA: NEGATIVE
Leukocytes,UA: NEGATIVE
Nitrite, UA: NEGATIVE
Protein,UA: NEGATIVE
RBC, UA: NEGATIVE
Specific Gravity, UA: 1.021 (ref 1.005–1.030)
Urobilinogen, Ur: 0.2 mg/dL (ref 0.2–1.0)
pH, UA: 6 (ref 5.0–7.5)

## 2022-07-20 LAB — LIPID PANEL WITH LDL/HDL RATIO
Cholesterol, Total: 180 mg/dL (ref 100–199)
HDL: 47 mg/dL
LDL Chol Calc (NIH): 121 mg/dL — ABNORMAL HIGH (ref 0–99)
LDL/HDL Ratio: 2.6 ratio (ref 0.0–3.2)
Triglycerides: 65 mg/dL (ref 0–149)
VLDL Cholesterol Cal: 12 mg/dL (ref 5–40)

## 2022-07-20 LAB — MICROSCOPIC EXAMINATION
Casts: NONE SEEN /lpf
RBC, Urine: NONE SEEN /hpf (ref 0–2)

## 2022-07-20 LAB — TSH+FREE T4
Free T4: 1.3 ng/dL (ref 0.82–1.77)
TSH: 2.55 u[IU]/mL (ref 0.450–4.500)

## 2022-07-20 NOTE — Telephone Encounter (Signed)
-----   Message from Carlean Jews, PA-C sent at 07/20/2022 12:52 PM EDT ----- Please let her know her labs look good except her cholesterol LDL is a little elevated and should work on diet and exercise to avoid too much fried/fatty foods.

## 2022-07-20 NOTE — Telephone Encounter (Signed)
Left message for patient regarding lab results. 

## 2022-07-29 DIAGNOSIS — Z113 Encounter for screening for infections with a predominantly sexual mode of transmission: Secondary | ICD-10-CM | POA: Diagnosis not present

## 2022-07-29 DIAGNOSIS — N9089 Other specified noninflammatory disorders of vulva and perineum: Secondary | ICD-10-CM | POA: Diagnosis not present

## 2022-08-03 ENCOUNTER — Other Ambulatory Visit: Payer: Self-pay

## 2022-10-07 DIAGNOSIS — Z20822 Contact with and (suspected) exposure to covid-19: Secondary | ICD-10-CM | POA: Diagnosis not present

## 2022-10-21 ENCOUNTER — Ambulatory Visit: Payer: Commercial Managed Care - PPO | Admitting: Physician Assistant

## 2022-10-28 ENCOUNTER — Telehealth: Payer: Self-pay | Admitting: Physician Assistant

## 2022-10-28 NOTE — Telephone Encounter (Signed)
Called and lvm to reschedule missed appt on 10/21/22

## 2023-03-16 DIAGNOSIS — N760 Acute vaginitis: Secondary | ICD-10-CM | POA: Diagnosis not present

## 2023-07-14 ENCOUNTER — Telehealth: Payer: Self-pay | Admitting: Physician Assistant

## 2023-07-14 NOTE — Telephone Encounter (Signed)
 Left vm to confirm 07/21/23 appointment-Toni

## 2023-07-21 ENCOUNTER — Encounter: Payer: Commercial Managed Care - PPO | Admitting: Physician Assistant

## 2023-07-27 ENCOUNTER — Telehealth: Payer: Self-pay | Admitting: Physician Assistant

## 2023-07-27 NOTE — Telephone Encounter (Signed)
 Lvm to reschedule 07/21/2023 missed appointment-tw /

## 2023-09-09 ENCOUNTER — Encounter: Payer: Self-pay | Admitting: Physician Assistant

## 2023-09-09 ENCOUNTER — Ambulatory Visit (INDEPENDENT_AMBULATORY_CARE_PROVIDER_SITE_OTHER): Admitting: Physician Assistant

## 2023-09-09 ENCOUNTER — Other Ambulatory Visit: Payer: Self-pay

## 2023-09-09 VITALS — BP 109/60 | HR 97 | Temp 98.3°F | Resp 16 | Ht 66.0 in | Wt 138.4 lb

## 2023-09-09 DIAGNOSIS — G43009 Migraine without aura, not intractable, without status migrainosus: Secondary | ICD-10-CM | POA: Diagnosis not present

## 2023-09-09 DIAGNOSIS — F411 Generalized anxiety disorder: Secondary | ICD-10-CM

## 2023-09-09 DIAGNOSIS — Z0001 Encounter for general adult medical examination with abnormal findings: Secondary | ICD-10-CM | POA: Diagnosis not present

## 2023-09-09 MED ORDER — BUPROPION HCL ER (XL) 150 MG PO TB24
150.0000 mg | ORAL_TABLET | Freq: Every day | ORAL | 1 refills | Status: DC
  Filled 2023-09-09: qty 90, 90d supply, fill #0

## 2023-09-09 NOTE — Progress Notes (Signed)
 [facility] [address] [place], [place] [address]  Internal MEDICINE  Office Visit Note  Patient Name: Danielle [name]  [id number]  [id number]  Date of Service: 09/09/2023  Chief Complaint  Patient presents with   Annual Exam   Medication Refill    Wellbutrin      HPI Pt is here for routine health maintenance examination -Finished school, looking for a job in [place] now and will be local vs going back to [place] -has not taken meds in awhile and headaches have been doing well without the topamax  recently -still looking for a psychiatrist/therapist now that she is in the area -calling GYN for pap today -would like to get back on wellbutrin  to help anxiety as well as some depression. She has taken before and understands importance of taking every morning for effectiveness -given lab slip  Current Medication: Outpatient Encounter Medications as of 09/09/2023  Medication Sig   [DISCONTINUED] buPROPion  (WELLBUTRIN  XL) 150 MG 24 hr tablet Take 1 tablet (150 mg total) by mouth daily.   [DISCONTINUED] cyclobenzaprine  (FLEXERIL ) 5 MG tablet Take 1 tablet (5 mg total) by mouth 2 (two) times daily as needed for muscle spasms for up to 10 days   [DISCONTINUED] ibuprofen (ADVIL,MOTRIN) 200 MG tablet Take 2 tablets at onset of headache, may repeat in 4-6 hours as needed   [DISCONTINUED] topiramate  (TOPAMAX ) 25 MG tablet Take 1 tablet (25 mg total) by mouth 2 (two) times daily.   buPROPion  (WELLBUTRIN  XL) 150 MG 24 hr tablet Take 1 tablet (150 mg total) by mouth daily.   No facility-administered encounter medications on file as of 09/09/2023.    Surgical History: Past Surgical History:  Procedure Laterality Date   TONSILLECTOMY AND ADENOIDECTOMY     WISDOM TOOTH EXTRACTION  2022    Medical History: Past Medical History:  Diagnosis Date   Headache    Memory loss     Family History: Family History  Problem Relation Age of Onset   Cancer Mother    Cancer Maternal  Aunt    Cancer Maternal Grandmother       Review of Systems  Constitutional:  Negative for chills, diaphoresis and fatigue.  HENT:  Negative for ear pain, postnasal drip and sinus pressure.   Eyes:  Negative for discharge, redness, itching and visual disturbance.  Respiratory:  Negative for cough, shortness of breath and wheezing.   Cardiovascular:  Negative for chest pain, palpitations and leg swelling.  Gastrointestinal:  Negative for abdominal pain, constipation, diarrhea, nausea and vomiting.  Genitourinary:  Negative for dysuria and flank pain.  Musculoskeletal:  Negative for arthralgias, back pain, gait problem and neck pain.  Skin:  Negative for color change.  Allergic/Immunologic: Negative for environmental allergies and food allergies.  Neurological:  Negative for dizziness.  Hematological:  Does not bruise/bleed easily.  Psychiatric/Behavioral:  Positive for dysphoric mood. Negative for agitation, hallucinations, self-injury and suicidal ideas. Behavioral problem: depression.The patient is nervous/anxious.      Vital Signs: BP 109/60   Pulse 97   Temp 98.3 F (36.8 C)   Resp 16   Ht 5' 6 (1.676 m)   Wt 138 lb 6.4 oz (62.8 kg)   SpO2 99%   BMI 22.34 kg/m    Physical Exam Vitals and nursing note reviewed.  Constitutional:      General: She is not in acute distress.    Appearance: Normal appearance. She is well-developed and normal weight. She is not diaphoretic.  HENT:  Head: Normocephalic and atraumatic.     Mouth/Throat:     Pharynx: No oropharyngeal exudate.  Eyes:     Pupils: Pupils are equal, round, and reactive to light.  Neck:     Thyroid : No thyromegaly.     Vascular: No JVD.     Trachea: No tracheal deviation.  Cardiovascular:     Rate and Rhythm: Normal rate and regular rhythm.     Heart sounds: Normal heart sounds. No murmur heard.    No friction rub. No gallop.  Pulmonary:     Effort: Pulmonary effort is normal. No respiratory distress.      Breath sounds: No wheezing or rales.  Chest:     Chest wall: No tenderness.  Abdominal:     General: Bowel sounds are normal.     Palpations: Abdomen is soft.     Tenderness: There is no abdominal tenderness.  Musculoskeletal:        General: Normal range of motion.     Cervical back: Normal range of motion and neck supple.  Lymphadenopathy:     Cervical: No cervical adenopathy.  Skin:    General: Skin is warm and dry.  Neurological:     Mental Status: She is alert and oriented to person, place, and time.     Cranial Nerves: No cranial nerve deficit.  Psychiatric:        Thought Content: Thought content normal.        Judgment: Judgment normal.     Comments: Fidgety in office       LABS: No results found for this or any previous visit (from the past 2160 hours).      Assessment/Plan: 1. Encounter for general adult medical examination with abnormal findings (Primary) CPE performed, lab slip given, will have pap with GYN and will obtain immunization records  2. GAD (generalized anxiety disorder) Will restart wellbutrin  daily - buPROPion  (WELLBUTRIN  XL) 150 MG 24 hr tablet; Take 1 tablet (150 mg total) by mouth daily.  Dispense: 90 tablet; Refill: 1  3. Migraine without aura and without status migrainosus, not intractable Improved and no longer taking topamax , will monitor   General Counseling: Danielle [name] verbalizes understanding of the findings of todays visit and agrees with plan of treatment. I have discussed any further diagnostic evaluation that may be needed or ordered today. We also reviewed her medications today. she has been encouraged to call the office with any questions or concerns that should arise related to todays visit.    Counseling:    No orders of the defined types were placed in this encounter.   Meds ordered this encounter  Medications   buPROPion  (WELLBUTRIN  XL) 150 MG 24 hr tablet    Sig: Take 1 tablet (150 mg total) by mouth daily.     Dispense:  90 tablet    Refill:  1    This patient was seen by Tinnie Pro, PA-C in collaboration with Dr. Sigrid Bathe as a part of collaborative care agreement.  Total time spent:35 Minutes  Time spent includes review of chart, medications, test results, and follow up plan with the patient.     Sigrid CHRISTELLA Bathe, MD  Internal Medicine

## 2023-11-07 ENCOUNTER — Telehealth: Payer: Self-pay

## 2023-11-07 NOTE — Telephone Encounter (Signed)
 Lmom that we are returning her call regarding med change

## 2023-11-28 ENCOUNTER — Other Ambulatory Visit: Payer: Self-pay

## 2023-11-28 ENCOUNTER — Ambulatory Visit (INDEPENDENT_AMBULATORY_CARE_PROVIDER_SITE_OTHER): Admitting: Physician Assistant

## 2023-11-28 ENCOUNTER — Encounter: Payer: Self-pay | Admitting: Physician Assistant

## 2023-11-28 VITALS — BP 120/62 | HR 86 | Temp 98.3°F | Resp 16 | Ht 66.0 in | Wt 144.0 lb

## 2023-11-28 DIAGNOSIS — L299 Pruritus, unspecified: Secondary | ICD-10-CM

## 2023-11-28 DIAGNOSIS — F411 Generalized anxiety disorder: Secondary | ICD-10-CM

## 2023-11-28 MED ORDER — SERTRALINE HCL 25 MG PO TABS
25.0000 mg | ORAL_TABLET | Freq: Every day | ORAL | 3 refills | Status: DC
  Filled 2023-11-28: qty 30, 30d supply, fill #0

## 2023-11-28 NOTE — Progress Notes (Signed)
 [facility] [address] [place], [place] [address]  Internal MEDICINE  Office Visit Note  Patient Name: Danielle [name]  [id number]  [id number]  Date of Service: 11/28/2023  Chief Complaint  Patient presents with   Follow-up   Medication Management    Wellbutrin  helping with panic attacks but now not sleeping    HPI Pt is here for routine follow up -Taking wellbutrin  at 7:30AM and is helping--less anxious, but sleep impacted now. Reports not sleeping well and using melatonin nightly -She has tried prozac  before and reports S/E of decreased sexual desire, but is open to alternatives -has not gotten in with psych or therapist and is still thinking about this -working full time now first haematologist center in [place] -pap in Jan with GYN -reports a need to itch scalp/forehead and isnt sure if mental or physical concern. She is going to try alternative shampoo in case dry scalp is causing need or if it is more of a nervous response. She has been planning to see dermatology as well  Current Medication: Outpatient Encounter Medications as of 11/28/2023  Medication Sig   buPROPion  (WELLBUTRIN  XL) 150 MG 24 hr tablet Take 1 tablet (150 mg total) by mouth daily.   No facility-administered encounter medications on file as of 11/28/2023.    Surgical History: Past Surgical History:  Procedure Laterality Date   TONSILLECTOMY AND ADENOIDECTOMY     WISDOM TOOTH EXTRACTION  2022    Medical History: Past Medical History:  Diagnosis Date   Headache    Memory loss     Family History: Family History  Problem Relation Age of Onset   Cancer Mother    Cancer Maternal Aunt    Cancer Maternal Grandmother     Social History   Socioeconomic History   Marital status: Single    Spouse name: Not on file   Number of children: Not on file   Years of education: Not on file   Highest education level: Not on file  Occupational History   Not on file  Tobacco Use    Smoking status: Never   Smokeless tobacco: Never  Vaping Use   Vaping status: Every Day  Substance and Sexual Activity   Alcohol use: Not on file   Drug use: Never   Sexual activity: Yes  Other Topics Concern   Not on file  Social History Narrative   Lives with mom, grandfather, step father and younger sister   Home schooled going into 10th grade.   Social Drivers of Corporate Investment Banker Strain: Not on file  Food Insecurity: Not on file  Transportation Needs: Not on file  Physical Activity: Not on file  Stress: Not on file  Social Connections: Not on file  Intimate Partner Violence: Not on file      Review of Systems  Constitutional:  Negative for chills, diaphoresis and fatigue.  HENT:  Negative for ear pain, postnasal drip and sinus pressure.   Eyes:  Negative for discharge, redness, itching and visual disturbance.  Respiratory:  Negative for cough, shortness of breath and wheezing.   Cardiovascular:  Negative for chest pain, palpitations and leg swelling.  Gastrointestinal:  Negative for abdominal pain, constipation, diarrhea, nausea and vomiting.  Genitourinary:  Negative for dysuria and flank pain.  Musculoskeletal:  Negative for arthralgias, back pain, gait problem and neck pain.  Skin:  Negative for color change.       Itchy scalp  Allergic/Immunologic: Negative for environmental allergies and  food allergies.  Neurological:  Negative for dizziness.  Hematological:  Does not bruise/bleed easily.  Psychiatric/Behavioral:  Positive for dysphoric mood. Negative for agitation, hallucinations, self-injury and suicidal ideas. Behavioral problem: depression.The patient is nervous/anxious.     Vital Signs: BP 120/62   Pulse 86   Temp 98.3 F (36.8 C)   Resp 16   Ht 5' 6 (1.676 m)   Wt 144 lb (65.3 kg)   SpO2 100%   BMI 23.24 kg/m    Physical Exam Vitals and nursing note reviewed.  Constitutional:      General: She is not in acute distress.     Appearance: Normal appearance. She is well-developed and normal weight. She is not diaphoretic.  HENT:     Head: Normocephalic and atraumatic.  Eyes:     Extraocular Movements: Extraocular movements intact.  Neck:     Thyroid : No thyromegaly.     Vascular: No JVD.     Trachea: No tracheal deviation.  Cardiovascular:     Rate and Rhythm: Normal rate and regular rhythm.     Heart sounds: Normal heart sounds. No murmur heard.    No friction rub. No gallop.  Pulmonary:     Effort: Pulmonary effort is normal. No respiratory distress.     Breath sounds: No wheezing or rales.  Chest:     Chest wall: No tenderness.  Skin:    General: Skin is warm and dry.     Findings: Lesion present.     Comments: Some excoriations along hair line of forehead  Neurological:     Mental Status: She is alert and oriented to person, place, and time.  Psychiatric:        Thought Content: Thought content normal.        Judgment: Judgment normal.     Comments: Fidgety in office         Assessment/Plan: 1. Itchy scalp (Primary) Will try alternative shampoo, possible head and shoulders to see if this helps. Also may be anxiety related and will treat accordingly. Plans to see dermatology and psychiatry  2. GAD (generalized anxiety disorder) Will taper wellbutrin  and start on zoloft daily. Titrate as needed in future - sertraline (ZOLOFT) 25 MG tablet; Take 1 tablet (25 mg total) by mouth daily.  Dispense: 30 tablet; Refill: 3   General Counseling: Raniya verbalizes understanding of the findings of todays visit and agrees with plan of treatment. I have discussed any further diagnostic evaluation that may be needed or ordered today. We also reviewed her medications today. she has been encouraged to call the office with any questions or concerns that should arise related to todays visit.    No orders of the defined types were placed in this encounter.   No orders of the defined types were placed in this  encounter.   This patient was seen by Tinnie Pro, PA-C in collaboration with Dr. Sigrid Bathe as a part of collaborative care agreement.   Total time spent:30 Minutes Time spent includes review of chart, medications, test results, and follow up plan with the patient.      Dr Fozia M Khan Internal medicine

## 2023-12-09 ENCOUNTER — Ambulatory Visit: Admitting: Physician Assistant

## 2023-12-26 ENCOUNTER — Ambulatory Visit (INDEPENDENT_AMBULATORY_CARE_PROVIDER_SITE_OTHER): Admitting: Physician Assistant

## 2023-12-26 ENCOUNTER — Other Ambulatory Visit: Payer: Self-pay

## 2023-12-26 ENCOUNTER — Encounter: Payer: Self-pay | Admitting: Physician Assistant

## 2023-12-26 VITALS — BP 112/60 | HR 84 | Temp 98.3°F | Resp 16 | Ht 66.0 in | Wt 138.0 lb

## 2023-12-26 DIAGNOSIS — F411 Generalized anxiety disorder: Secondary | ICD-10-CM | POA: Diagnosis not present

## 2023-12-26 MED ORDER — SERTRALINE HCL 50 MG PO TABS
50.0000 mg | ORAL_TABLET | Freq: Every day | ORAL | 3 refills | Status: AC
  Filled 2023-12-26: qty 30, 30d supply, fill #0
  Filled 2024-02-14: qty 30, 30d supply, fill #1

## 2023-12-26 NOTE — Progress Notes (Signed)
 [facility] [address] [place], [place] [address]  Internal MEDICINE  Office Visit Note  Patient Name: Danielle [name]  [id number]  [id number]  Date of Service: 01/04/2024  Chief Complaint  Patient presents with   Follow-up   Anxiety    Having panic attacks again, wanting to increase Zoloft  bc it is helping   Medication Refill    HPI Pt is here for routine follow up for anxiety -waiting to hear back from from therapist -will get labs done -likes zoloft  but would like to increase dose, still has occasional panic attacks. About 2 per week which is better than it was without medication.  States panic attacks were well controlled on wellbutrin  alone and did not require additional medication for these, but unfortunately the S/E to her sleep was too much to continue this. -sleeping better now that wellbutrin  stopped  Current Medication: Outpatient Encounter Medications as of 12/26/2023  Medication Sig   sertraline  (ZOLOFT ) 50 MG tablet Take 1 tablet (50 mg total) by mouth daily.   [DISCONTINUED] buPROPion  (WELLBUTRIN  XL) 150 MG 24 hr tablet Take 1 tablet (150 mg total) by mouth daily.   [DISCONTINUED] sertraline  (ZOLOFT ) 25 MG tablet Take 1 tablet (25 mg total) by mouth daily.   No facility-administered encounter medications on file as of 12/26/2023.    Surgical History: Past Surgical History:  Procedure Laterality Date   TONSILLECTOMY AND ADENOIDECTOMY     WISDOM TOOTH EXTRACTION  2022    Medical History: Past Medical History:  Diagnosis Date   Headache    Memory loss     Family History: Family History  Problem Relation Age of Onset   Cancer Mother    Cancer Maternal Aunt    Cancer Maternal Grandmother     Social History   Socioeconomic History   Marital status: Single    Spouse name: Not on file   Number of children: Not on file   Years of education: Not on file   Highest education level: Not on file  Occupational History   Not on file   Tobacco Use   Smoking status: Never   Smokeless tobacco: Never  Vaping Use   Vaping status: Every Day  Substance and Sexual Activity   Alcohol use: Not on file   Drug use: Never   Sexual activity: Yes  Other Topics Concern   Not on file  Social History Narrative   Lives with mom, grandfather, step father and younger sister   Home schooled going into 10th grade.   Social Drivers of Corporate Investment Banker Strain: Not on file  Food Insecurity: Not on file  Transportation Needs: Not on file  Physical Activity: Not on file  Stress: Not on file  Social Connections: Not on file  Intimate Partner Violence: Not on file      Review of Systems  Constitutional:  Negative for chills, diaphoresis and fatigue.  HENT:  Negative for ear pain, postnasal drip and sinus pressure.   Eyes:  Negative for discharge, redness, itching and visual disturbance.  Respiratory:  Negative for cough, shortness of breath and wheezing.   Cardiovascular:  Negative for chest pain, palpitations and leg swelling.  Gastrointestinal:  Negative for abdominal pain, constipation, diarrhea, nausea and vomiting.  Genitourinary:  Negative for dysuria and flank pain.  Musculoskeletal:  Negative for arthralgias, back pain, gait problem and neck pain.  Skin:  Negative for color change.  Allergic/Immunologic: Negative for environmental allergies and food allergies.  Neurological:  Negative for dizziness.  Hematological:  Does not bruise/bleed easily.  Psychiatric/Behavioral:  Positive for dysphoric mood. Negative for agitation, hallucinations, self-injury, sleep disturbance and suicidal ideas. Behavioral problem: depression.The patient is nervous/anxious.     Vital Signs: BP 112/60   Pulse 84   Temp 98.3 F (36.8 C)   Resp 16   Ht 5' 6 (1.676 m)   Wt 138 lb (62.6 kg)   SpO2 98%   BMI 22.27 kg/m    Physical Exam Vitals and nursing note reviewed.  Constitutional:      General: She is not in acute  distress.    Appearance: Normal appearance. She is well-developed and normal weight. She is not diaphoretic.  HENT:     Head: Normocephalic and atraumatic.  Eyes:     Extraocular Movements: Extraocular movements intact.  Neck:     Thyroid : No thyromegaly.     Vascular: No JVD.     Trachea: No tracheal deviation.  Cardiovascular:     Rate and Rhythm: Normal rate and regular rhythm.     Heart sounds: Normal heart sounds. No murmur heard.    No friction rub. No gallop.  Pulmonary:     Effort: Pulmonary effort is normal. No respiratory distress.     Breath sounds: No wheezing or rales.  Chest:     Chest wall: No tenderness.  Musculoskeletal:        General: Normal range of motion.  Skin:    General: Skin is warm and dry.  Neurological:     Mental Status: She is alert and oriented to person, place, and time.  Psychiatric:        Thought Content: Thought content normal.        Judgment: Judgment normal.     Comments: Fidgety in office         Assessment/Plan: 1. GAD (generalized anxiety disorder) (Primary) Improving, but still having occasional panic attacks. Will increase to 50mg  zoloft  and pt will call if any concerns - sertraline  (ZOLOFT ) 50 MG tablet; Take 1 tablet (50 mg total) by mouth daily.  Dispense: 30 tablet; Refill: 3   General Counseling: Shareeka verbalizes understanding of the findings of todays visit and agrees with plan of treatment. I have discussed any further diagnostic evaluation that may be needed or ordered today. We also reviewed her medications today. she has been encouraged to call the office with any questions or concerns that should arise related to todays visit.    No orders of the defined types were placed in this encounter.   Meds ordered this encounter  Medications   sertraline  (ZOLOFT ) 50 MG tablet    Sig: Take 1 tablet (50 mg total) by mouth daily.    Dispense:  30 tablet    Refill:  3    This patient was seen by Tinnie Pro, PA-C  in collaboration with Dr. Sigrid Bathe as a part of collaborative care agreement.   Total time spent: 25 Minutes Time spent includes review of chart, medications, test results, and follow up plan with the patient.      Dr Fozia M Khan Internal medicine

## 2024-01-04 ENCOUNTER — Other Ambulatory Visit: Payer: Self-pay

## 2024-02-13 ENCOUNTER — Encounter: Payer: Self-pay | Admitting: Physician Assistant

## 2024-02-13 ENCOUNTER — Ambulatory Visit (INDEPENDENT_AMBULATORY_CARE_PROVIDER_SITE_OTHER): Admitting: Physician Assistant

## 2024-02-13 VITALS — BP 91/61 | HR 91 | Temp 98.0°F | Resp 16 | Ht 66.0 in | Wt 145.0 lb

## 2024-02-13 DIAGNOSIS — M546 Pain in thoracic spine: Secondary | ICD-10-CM

## 2024-02-13 DIAGNOSIS — F411 Generalized anxiety disorder: Secondary | ICD-10-CM

## 2024-02-13 NOTE — Progress Notes (Unsigned)
 [facility] [address] [place], [place] [address]  Internal MEDICINE  Office Visit Note  Patient Name: Danielle [name]  [id number]  [id number]  Date of Service: 02/13/2024  Chief Complaint  Patient presents with   Follow-up    Did not get labs done   Anxiety    50mg  Lexapro working well   Back Pain    Has scoliosis, but has not had imaging done in 10 years, was at 10% during that time.    HPI Pt is here for routine follow up -Doing well with zoloft , no more panic attacks. No sleep issues on this. No S/E -hx of scoliosis. Stays with BF 2-3 nights per week and then home other nights, so inconsistent mattress and in the car more. Trying to stretch more. Middle of mid back. -no injury -trying to go to gym. Building up to goal of 3x/week. Plan to start yoga as well. Did this previously and felt the best when doing this.  -using ibuprofen as needed when it is bad -Electric blanket -drinking more water -will go for labs on Monday -states pediatrician should have been sent records?  Current Medication: Outpatient Encounter Medications as of 02/13/2024  Medication Sig   sertraline  (ZOLOFT ) 50 MG tablet Take 1 tablet (50 mg total) by mouth daily.   No facility-administered encounter medications on file as of 02/13/2024.    Surgical History: Past Surgical History:  Procedure Laterality Date   TONSILLECTOMY AND ADENOIDECTOMY     WISDOM TOOTH EXTRACTION  2022    Medical History: Past Medical History:  Diagnosis Date   Headache    Memory loss     Family History: Family History  Problem Relation Age of Onset   Cancer Mother    Cancer Maternal Aunt    Cancer Maternal Grandmother     Social History   Socioeconomic History   Marital status: Single    Spouse name: Not on file   Number of children: Not on file   Years of education: Not on file   Highest education level: Not on file  Occupational History   Not on file  Tobacco Use   Smoking status:  Never   Smokeless tobacco: Never  Vaping Use   Vaping status: Every Day  Substance and Sexual Activity   Alcohol use: Not on file   Drug use: Never   Sexual activity: Yes  Other Topics Concern   Not on file  Social History Narrative   Lives with mom, grandfather, step father and younger sister   Home schooled going into 10th grade.   Social Drivers of Health   Tobacco Use: Low Risk (02/13/2024)   Patient History    Smoking Tobacco Use: Never    Smokeless Tobacco Use: Never    Passive Exposure: Not on file  Financial Resource Strain: Not on file  Food Insecurity: Not on file  Transportation Needs: Not on file  Physical Activity: Not on file  Stress: Not on file  Social Connections: Not on file  Intimate Partner Violence: Not on file  Depression (PHQ2-9): Low Risk (11/28/2023)   Depression (PHQ2-9)    PHQ-2 Score: 0  Alcohol Screen: Low Risk (09/09/2023)   Alcohol Screen    Last Alcohol Screening Score (AUDIT): 1  Housing: Not on file  Utilities: Not on file  Health Literacy: Not on file      Review of Systems  Vital Signs: BP 91/61   Pulse 91   Temp 98 F (36.7 C)  Resp 16   Ht 5' 6 (1.676 m)   Wt 145 lb (65.8 kg)   SpO2 98%   BMI 23.40 kg/m    Physical Exam     Assessment/Plan:   General Counseling: Vernon verbalizes understanding of the findings of todays visit and agrees with plan of treatment. I have discussed any further diagnostic evaluation that may be needed or ordered today. We also reviewed her medications today. she has been encouraged to call the office with any questions or concerns that should arise related to todays visit.    No orders of the defined types were placed in this encounter.   No orders of the defined types were placed in this encounter.   This patient was seen by Tinnie Pro, PA-C in collaboration with Dr. Sigrid Bathe as a part of collaborative care agreement.   Total time spent:*** Minutes Time spent includes  review of chart, medications, test results, and follow up plan with the patient.      Dr Fozia M Khan Internal medicine

## 2024-02-14 ENCOUNTER — Other Ambulatory Visit: Payer: Self-pay

## 2024-05-14 ENCOUNTER — Ambulatory Visit: Admitting: Physician Assistant

## 2024-09-13 ENCOUNTER — Encounter: Admitting: Physician Assistant
